# Patient Record
Sex: Male | Born: 1955 | Race: White | Hispanic: No | Marital: Single | Smoking: Former smoker
Health system: Southern US, Community
[De-identification: ages and names within clinical notes are randomized; demographics above are authoritative.]

## PROBLEM LIST (undated history)

## (undated) DIAGNOSIS — J45909 Unspecified asthma, uncomplicated: Secondary | ICD-10-CM

## (undated) DIAGNOSIS — E785 Hyperlipidemia, unspecified: Secondary | ICD-10-CM

## (undated) DIAGNOSIS — J449 Chronic obstructive pulmonary disease, unspecified: Secondary | ICD-10-CM

## (undated) DIAGNOSIS — M1990 Unspecified osteoarthritis, unspecified site: Secondary | ICD-10-CM

## (undated) DIAGNOSIS — T7840XA Allergy, unspecified, initial encounter: Secondary | ICD-10-CM

## (undated) DIAGNOSIS — F331 Major depressive disorder, recurrent, moderate: Secondary | ICD-10-CM

## (undated) DIAGNOSIS — K219 Gastro-esophageal reflux disease without esophagitis: Secondary | ICD-10-CM

## (undated) HISTORY — DX: Major depressive disorder, recurrent, moderate: F33.1

## (undated) HISTORY — DX: Allergy, unspecified, initial encounter: T78.40XA

## (undated) HISTORY — DX: Chronic obstructive pulmonary disease, unspecified: J44.9

## (undated) HISTORY — DX: Hyperlipidemia, unspecified: E78.5

## (undated) HISTORY — DX: Unspecified osteoarthritis, unspecified site: M19.90

## (undated) HISTORY — DX: Gastro-esophageal reflux disease without esophagitis: K21.9

## (undated) HISTORY — DX: Unspecified asthma, uncomplicated: J45.909

## (undated) HISTORY — PX: FOOT SURGERY: SHX648

---

## 2012-09-07 DIAGNOSIS — M25561 Pain in right knee: Secondary | ICD-10-CM | POA: Insufficient documentation

## 2012-09-07 DIAGNOSIS — S069X9A Unspecified intracranial injury with loss of consciousness of unspecified duration, initial encounter: Secondary | ICD-10-CM | POA: Insufficient documentation

## 2013-03-19 DIAGNOSIS — Z8551 Personal history of malignant neoplasm of bladder: Secondary | ICD-10-CM | POA: Insufficient documentation

## 2013-03-19 DIAGNOSIS — F331 Major depressive disorder, recurrent, moderate: Secondary | ICD-10-CM

## 2013-03-19 HISTORY — DX: Major depressive disorder, recurrent, moderate: F33.1

## 2013-04-09 DIAGNOSIS — E785 Hyperlipidemia, unspecified: Secondary | ICD-10-CM | POA: Insufficient documentation

## 2013-04-16 DIAGNOSIS — E559 Vitamin D deficiency, unspecified: Secondary | ICD-10-CM | POA: Insufficient documentation

## 2013-04-18 DIAGNOSIS — J449 Chronic obstructive pulmonary disease, unspecified: Secondary | ICD-10-CM | POA: Insufficient documentation

## 2013-04-18 DIAGNOSIS — G8929 Other chronic pain: Secondary | ICD-10-CM | POA: Insufficient documentation

## 2013-04-18 DIAGNOSIS — M25569 Pain in unspecified knee: Secondary | ICD-10-CM

## 2013-04-18 DIAGNOSIS — M5136 Other intervertebral disc degeneration, lumbar region: Secondary | ICD-10-CM | POA: Insufficient documentation

## 2015-10-27 DIAGNOSIS — K219 Gastro-esophageal reflux disease without esophagitis: Secondary | ICD-10-CM

## 2015-10-27 HISTORY — DX: Gastro-esophageal reflux disease without esophagitis: K21.9

## 2016-02-25 DIAGNOSIS — N401 Enlarged prostate with lower urinary tract symptoms: Secondary | ICD-10-CM | POA: Insufficient documentation

## 2016-02-25 DIAGNOSIS — R3912 Poor urinary stream: Principal | ICD-10-CM

## 2016-05-05 ENCOUNTER — Ambulatory Visit (INDEPENDENT_AMBULATORY_CARE_PROVIDER_SITE_OTHER): Payer: Medicaid Other

## 2016-05-05 ENCOUNTER — Ambulatory Visit (INDEPENDENT_AMBULATORY_CARE_PROVIDER_SITE_OTHER): Payer: Medicaid Other | Admitting: Family Medicine

## 2016-05-05 ENCOUNTER — Encounter: Payer: Self-pay | Admitting: Family Medicine

## 2016-05-05 ENCOUNTER — Encounter (INDEPENDENT_AMBULATORY_CARE_PROVIDER_SITE_OTHER): Payer: Self-pay

## 2016-05-05 VITALS — BP 138/88 | HR 72 | Temp 98.6°F | Ht 71.0 in | Wt 217.0 lb

## 2016-05-05 DIAGNOSIS — G8929 Other chronic pain: Secondary | ICD-10-CM

## 2016-05-05 DIAGNOSIS — M25561 Pain in right knee: Secondary | ICD-10-CM

## 2016-05-05 DIAGNOSIS — S069X0S Unspecified intracranial injury without loss of consciousness, sequela: Secondary | ICD-10-CM | POA: Diagnosis not present

## 2016-05-05 DIAGNOSIS — N401 Enlarged prostate with lower urinary tract symptoms: Secondary | ICD-10-CM

## 2016-05-05 DIAGNOSIS — R3912 Poor urinary stream: Secondary | ICD-10-CM

## 2016-05-05 DIAGNOSIS — J449 Chronic obstructive pulmonary disease, unspecified: Secondary | ICD-10-CM

## 2016-05-05 DIAGNOSIS — K219 Gastro-esophageal reflux disease without esophagitis: Secondary | ICD-10-CM

## 2016-05-05 DIAGNOSIS — M5416 Radiculopathy, lumbar region: Secondary | ICD-10-CM | POA: Diagnosis not present

## 2016-05-05 DIAGNOSIS — E785 Hyperlipidemia, unspecified: Secondary | ICD-10-CM | POA: Diagnosis not present

## 2016-05-05 DIAGNOSIS — Z8551 Personal history of malignant neoplasm of bladder: Secondary | ICD-10-CM | POA: Diagnosis not present

## 2016-05-05 DIAGNOSIS — F331 Major depressive disorder, recurrent, moderate: Secondary | ICD-10-CM | POA: Diagnosis not present

## 2016-05-05 DIAGNOSIS — M5136 Other intervertebral disc degeneration, lumbar region: Secondary | ICD-10-CM | POA: Diagnosis not present

## 2016-05-05 LAB — URINALYSIS, COMPLETE
Bilirubin, UA: NEGATIVE
Glucose, UA: NEGATIVE
KETONES UA: NEGATIVE
Leukocytes, UA: NEGATIVE
Nitrite, UA: NEGATIVE
Protein, UA: NEGATIVE
Urobilinogen, Ur: 0.2 mg/dL (ref 0.2–1.0)
pH, UA: 5.5 (ref 5.0–7.5)

## 2016-05-05 LAB — MICROSCOPIC EXAMINATION
Bacteria, UA: NONE SEEN
RENAL EPITHEL UA: NONE SEEN /HPF
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
## 2016-05-06 LAB — LIPID PANEL
CHOLESTEROL TOTAL: 184 mg/dL (ref 100–199)
Chol/HDL Ratio: 4.5 ratio units (ref 0.0–5.0)
HDL: 41 mg/dL (ref >39)
LDL CALC: 109 mg/dL — AB (ref 0–99)
TRIGLYCERIDES: 171 mg/dL — AB (ref 0–149)
VLDL CHOLESTEROL CAL: 34 mg/dL (ref 5–40)

## 2016-05-06 LAB — CMP14+EGFR
ALK PHOS: 107 IU/L (ref 39–117)
ALT: 18 IU/L (ref 0–44)
AST: 24 IU/L (ref 0–40)
Albumin/Globulin Ratio: 1.6 (ref 1.2–2.2)
Albumin: 4.3 g/dL (ref 3.5–5.5)
BILIRUBIN TOTAL: 0.3 mg/dL (ref 0.0–1.2)
BUN/Creatinine Ratio: 14 (ref 9–20)
BUN: 15 mg/dL (ref 6–24)
CHLORIDE: 100 mmol/L (ref 96–106)
CO2: 21 mmol/L (ref 18–29)
CREATININE: 1.1 mg/dL (ref 0.76–1.27)
Calcium: 9.3 mg/dL (ref 8.7–10.2)
GFR calc Af Amer: 84 mL/min/{1.73_m2} (ref >59)
GFR calc non Af Amer: 73 mL/min/{1.73_m2} (ref >59)
GLUCOSE: 89 mg/dL (ref 65–99)
Globulin, Total: 2.7 g/dL (ref 1.5–4.5)
Potassium: 4 mmol/L (ref 3.5–5.2)
SODIUM: 141 mmol/L (ref 134–144)
Total Protein: 7 g/dL (ref 6.0–8.5)

## 2016-05-06 LAB — CBC WITH DIFFERENTIAL/PLATELET
BASOS ABS: 0.1 10*3/uL (ref 0.0–0.2)
Basos: 1 %
EOS (ABSOLUTE): 0.9 10*3/uL — AB (ref 0.0–0.4)
Eos: 13 %
Hematocrit: 46.1 % (ref 37.5–51.0)
Hemoglobin: 14.8 g/dL (ref 13.0–17.7)
Immature Grans (Abs): 0 10*3/uL (ref 0.0–0.1)
Immature Granulocytes: 0 %
LYMPHS ABS: 1.5 10*3/uL (ref 0.7–3.1)
Lymphs: 21 %
MCH: 30 pg (ref 26.6–33.0)
MCHC: 32.1 g/dL (ref 31.5–35.7)
MCV: 93 fL (ref 79–97)
MONOCYTES: 11 %
MONOS ABS: 0.8 10*3/uL (ref 0.1–0.9)
Neutrophils Absolute: 3.9 10*3/uL (ref 1.4–7.0)
Neutrophils: 54 %
PLATELETS: 156 10*3/uL (ref 150–379)
RBC: 4.94 x10E6/uL (ref 4.14–5.80)
RDW: 14.2 % (ref 12.3–15.4)
WBC: 7.1 10*3/uL (ref 3.4–10.8)

## 2016-05-07 NOTE — Progress Notes (Signed)
Your chest x-ray looked normal. Thanks, WS.

## 2016-07-13 ENCOUNTER — Other Ambulatory Visit: Payer: Self-pay | Admitting: *Deleted

## 2016-07-13 MED ORDER — ALBUTEROL SULFATE HFA 108 (90 BASE) MCG/ACT IN AERS: INHALATION_SPRAY | RESPIRATORY_TRACT | 3 refills | Status: AC

## 2016-08-02 ENCOUNTER — Other Ambulatory Visit: Payer: Self-pay | Admitting: Family Medicine

## 2016-08-15 ENCOUNTER — Ambulatory Visit: Payer: Medicaid Other | Admitting: Family Medicine

## 2016-08-16 ENCOUNTER — Encounter: Payer: Self-pay | Admitting: Family Medicine

## 2016-08-16 ENCOUNTER — Telehealth: Payer: Self-pay | Admitting: Family Medicine

## 2016-08-16 NOTE — Telephone Encounter (Signed)
Attempted to contact patient- number is no longer in service.

## 2016-10-09 ENCOUNTER — Other Ambulatory Visit: Payer: Self-pay | Admitting: Family Medicine

## 2016-10-30 ENCOUNTER — Telehealth: Payer: Self-pay | Admitting: Family Medicine

## 2016-10-30 NOTE — Telephone Encounter (Signed)
Pt recent hospitalization  appt scheduled for follow up

## 2016-10-31 ENCOUNTER — Other Ambulatory Visit: Payer: Self-pay | Admitting: Family Medicine

## 2016-11-01 ENCOUNTER — Ambulatory Visit (INDEPENDENT_AMBULATORY_CARE_PROVIDER_SITE_OTHER): Payer: Medicaid Other | Admitting: Family Medicine

## 2016-11-01 ENCOUNTER — Ambulatory Visit (INDEPENDENT_AMBULATORY_CARE_PROVIDER_SITE_OTHER): Payer: Medicaid Other

## 2016-11-01 ENCOUNTER — Encounter: Payer: Self-pay | Admitting: Family Medicine

## 2016-11-01 VITALS — BP 121/75 | HR 64 | Temp 98.6°F | Ht 71.0 in | Wt 222.0 lb

## 2016-11-01 DIAGNOSIS — N2889 Other specified disorders of kidney and ureter: Secondary | ICD-10-CM | POA: Diagnosis not present

## 2016-11-01 DIAGNOSIS — J449 Chronic obstructive pulmonary disease, unspecified: Secondary | ICD-10-CM

## 2016-11-01 DIAGNOSIS — R0602 Shortness of breath: Secondary | ICD-10-CM

## 2016-11-01 MED ORDER — BUDESONIDE-FORMOTEROL FUMARATE 160-4.5 MCG/ACT IN AERO: 2.0000 | INHALATION_SPRAY | Freq: Two times a day (BID) | RESPIRATORY_TRACT | 3 refills | Status: AC

## 2016-11-01 MED ORDER — LEVOFLOXACIN 500 MG PO TABS: 500.0000 mg | ORAL_TABLET | Freq: Every day | ORAL | 0 refills | Status: DC

## 2016-11-01 MED ORDER — PREDNISONE 10 MG PO TABS: ORAL_TABLET | ORAL | 0 refills | Status: DC

## 2016-11-01 NOTE — Telephone Encounter (Signed)
Refills sent in for 6 months as pt was just seen today. Ok per [employer].
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
## 2016-11-01 NOTE — Telephone Encounter (Signed)
Authorize 30 days only. Then contact the patient letting them know that they will need an appointment before any further prescriptions can be sent in. 

## 2016-11-01 NOTE — Telephone Encounter (Signed)
Last seen 05/05/16  Dr Stacks 

## 2016-11-13 ENCOUNTER — Telehealth: Payer: Self-pay | Admitting: Family Medicine

## 2016-11-13 ENCOUNTER — Other Ambulatory Visit: Payer: Self-pay | Admitting: Family Medicine

## 2016-11-13 DIAGNOSIS — J449 Chronic obstructive pulmonary disease, unspecified: Secondary | ICD-10-CM

## 2016-11-13 MED ORDER — ALBUTEROL SULFATE 1.25 MG/3ML IN NEBU: 1.0000 | INHALATION_SOLUTION | Freq: Four times a day (QID) | RESPIRATORY_TRACT | 12 refills | Status: DC | PRN

## 2016-11-13 NOTE — Telephone Encounter (Signed)
I sent in the requested prescription 

## 2016-11-14 NOTE — Telephone Encounter (Signed)
Naomi aware that prescription has been sent in

## 2016-11-20 ENCOUNTER — Telehealth: Payer: Self-pay | Admitting: Family Medicine

## 2016-11-20 DIAGNOSIS — J209 Acute bronchitis, unspecified: Secondary | ICD-10-CM

## 2016-11-20 DIAGNOSIS — J449 Chronic obstructive pulmonary disease, unspecified: Secondary | ICD-10-CM

## 2016-11-20 DIAGNOSIS — J440 Chronic obstructive pulmonary disease with acute lower respiratory infection: Principal | ICD-10-CM

## 2016-11-20 NOTE — Telephone Encounter (Signed)
DME order printed and placed on his desk to sign and pt will come by and grab it tomorrow after lunch.

## 2016-11-20 NOTE — Telephone Encounter (Signed)
Please send order for nebulizer machine to [facility]

## 2016-11-22 ENCOUNTER — Other Ambulatory Visit: Payer: Self-pay | Admitting: Family Medicine

## 2016-11-23 ENCOUNTER — Other Ambulatory Visit: Payer: Self-pay | Admitting: Family Medicine

## 2016-12-03 ENCOUNTER — Other Ambulatory Visit: Payer: Self-pay | Admitting: Family Medicine

## 2016-12-05 ENCOUNTER — Encounter: Payer: Self-pay | Admitting: Family Medicine

## 2016-12-05 ENCOUNTER — Ambulatory Visit (INDEPENDENT_AMBULATORY_CARE_PROVIDER_SITE_OTHER): Payer: Medicaid Other | Admitting: Family Medicine

## 2016-12-05 VITALS — BP 110/71 | HR 58 | Temp 99.0°F | Ht 71.0 in | Wt 219.0 lb

## 2016-12-05 DIAGNOSIS — J449 Chronic obstructive pulmonary disease, unspecified: Secondary | ICD-10-CM

## 2016-12-05 NOTE — Progress Notes (Signed)
Subjective:  Patient ID: Victor Hanson, male    DOB: 1955-05-06  Age: 61 y.o. MRN: [id number]  CC: Follow-up (pt here today for 1 month follow up)   HPI Victor Hanson presents for Recheck of his emphysema. He is not getting short of breath anymore. His son-in-law is with him today and says that he has started managing his medications. Patient admits that he was confused before and that his son-in-law's help his been very useful.  Depression screen [facility] 2/9 11/01/2016 05/05/2016  Decreased Interest 0 0  Down, Depressed, Hopeless 1 1  PHQ - 2 Score 1 1    History Victor Hanson has a past medical history of Allergy; Arthritis; Asthma; COPD (chronic obstructive pulmonary disease) ([place]); Depression, major, recurrent, moderate ([place]) (03/19/2013); Gastroesophageal reflux disease without esophagitis (10/27/2015); and Hyperlipidemia.   He has a past surgical history that includes Foot surgery.   His family history includes Diabetes in his mother; Heart disease (age of onset: 14) in his father; Hypertension in his father.He reports that he quit smoking about 4 years ago. He has never used smokeless tobacco. He reports that he does not drink alcohol or use drugs.    ROS Review of Systems  Constitutional: Negative for chills, diaphoresis and fever.  HENT: Negative for rhinorrhea and sore throat.   Respiratory: Negative for cough and shortness of breath.   Cardiovascular: Negative for chest pain.  Gastrointestinal: Negative for abdominal pain.  Skin: Negative for rash.    Objective:  BP 110/71   Pulse (!) 58   Temp 99 F (37.2 C) (Oral)   Ht 5' 11" (1.803 m)   Wt 219 lb (99.3 kg)   BMI 30.54 kg/m   BP Readings from Last 3 Encounters:  12/05/16 110/71  11/01/16 121/75  05/05/16 138/88    Wt Readings from Last 3 Encounters:  12/05/16 219 lb (99.3 kg)  11/01/16 222 lb (100.7 kg)  05/05/16 217 lb (98.4 kg)     Physical Exam  Constitutional: He is oriented to person, place, and time. He  appears well-developed and well-nourished.  HENT:  Head: Normocephalic and atraumatic.  Right Ear: External ear normal.  Left Ear: External ear normal.  Mouth/Throat: No oropharyngeal exudate or posterior oropharyngeal erythema.  Eyes: Pupils are equal, round, and reactive to light.  Neck: Normal range of motion. Neck supple.  Cardiovascular: Normal rate and regular rhythm.   No murmur heard. Pulmonary/Chest: Breath sounds normal. No respiratory distress.  Neurological: He is alert and oriented to person, place, and time.  Vitals reviewed.     Assessment & Plan:   Victor Hanson was seen today for follow-up.  Diagnoses and all orders for this visit:  Chronic obstructive pulmonary disease, unspecified COPD type ([place]) -     CMP14+EGFR -     CBC with Differential/Platelet -     Lipid panel -     PR BREATHING CAPACITY TEST       I have discontinued Victor Hanson predniSONE and levofloxacin. I am also having him maintain his aspirin EC, loratadine, nitroGLYCERIN, tamsulosin, albuterol, pantoprazole, furosemide, sertraline, buPROPion, budesonide-formoterol, albuterol, atorvastatin, doxepin, meloxicam, clopidogrel, and metoprolol tartrate.  Allergies as of 12/05/2016   No Known Allergies     Medication List       Accurate as of 12/05/16  5:19 PM. Always use your most recent med list.          albuterol 108 (90 Base) MCG/ACT inhaler Commonly known as:  PROVENTIL HFA;VENTOLIN HFA INHALE 2 PUFFS BY  MOUTH EVERY 4-6 HOURS AS NEEDED   albuterol 1.25 MG/3ML nebulizer solution Commonly known as:  ACCUNEB Take 3 mLs (1.25 mg total) by nebulization every 6 (six) hours as needed for wheezing.   aspirin EC 81 MG tablet Take 81 mg by mouth.   atorvastatin 40 MG tablet Commonly known as:  LIPITOR TAKE 1 TABLET (40 MG TOTAL) BY MOUTH DAILY AT 6PM.   budesonide-formoterol 160-4.5 MCG/ACT inhaler Commonly known as:  SYMBICORT Inhale 2 puffs into the lungs 2 (two) times daily.   buPROPion  100 MG 12 hr tablet Commonly known as:  WELLBUTRIN SR TAKE 1 TABLET EVERY MORNING   clopidogrel 75 MG tablet Commonly known as:  PLAVIX TAKE 1 TABLET (75 MG TOTAL) BY MOUTH DAILY.   doxepin 10 MG capsule Commonly known as:  SINEQUAN TAKE 1 CAPSULE BY MOUTH THREE TIMES A DAY   furosemide 20 MG tablet Commonly known as:  LASIX TAKE 1 TABLET BY MOUTH EVERY DAY   loratadine 10 MG tablet Commonly known as:  CLARITIN Take 10 mg by mouth.   meloxicam 15 MG tablet Commonly known as:  MOBIC TAKE 1 TABLET EVERY DAY AS NEEDED FOR PAIN   metoprolol tartrate 25 MG tablet Commonly known as:  LOPRESSOR TAKE 1/2 TABLET TWICE A DAY   nitroGLYCERIN 0.4 MG SL tablet Commonly known as:  NITROSTAT Place 0.4 mg under the tongue.   pantoprazole 40 MG tablet Commonly known as:  PROTONIX TAKE 1 TABLET BY MOUTH EVERY DAY   sertraline 100 MG tablet Commonly known as:  ZOLOFT TAKE 1 AND 1/2 TABLETS BY MOUTH EVERY DAY   tamsulosin 0.4 MG Caps capsule Commonly known as:  FLOMAX Take 0.4 mg by mouth daily.            Discharge Care Instructions        Start     Ordered   12/05/16 0000  CMP14+EGFR     12/05/16 1600   12/05/16 0000  CBC with Differential/Platelet     12/05/16 1600   12/05/16 0000  Lipid panel     12/05/16 1600   12/05/16 0000  PR BREATHING CAPACITY TEST     12/05/16 1600       Follow-up: No Follow-up on file.  Claretta Fraise, M.D.

## 2016-12-06 LAB — CBC WITH DIFFERENTIAL/PLATELET
Basophils Absolute: 0.1 10*3/uL (ref 0.0–0.2)
Basos: 1 %
EOS (ABSOLUTE): 0.8 10*3/uL — AB (ref 0.0–0.4)
Eos: 10 %
HEMATOCRIT: 39.9 % (ref 37.5–51.0)
Hemoglobin: 13 g/dL (ref 13.0–17.7)
IMMATURE GRANULOCYTES: 0 %
Immature Grans (Abs): 0 10*3/uL (ref 0.0–0.1)
LYMPHS ABS: 1.2 10*3/uL (ref 0.7–3.1)
Lymphs: 15 %
MCH: 29.7 pg (ref 26.6–33.0)
MCHC: 32.6 g/dL (ref 31.5–35.7)
MCV: 91 fL (ref 79–97)
MONOS ABS: 0.9 10*3/uL (ref 0.1–0.9)
Monocytes: 11 %
NEUTROS PCT: 63 %
Neutrophils Absolute: 4.8 10*3/uL (ref 1.4–7.0)
PLATELETS: 127 10*3/uL — AB (ref 150–379)
RBC: 4.37 x10E6/uL (ref 4.14–5.80)
RDW: 14.1 % (ref 12.3–15.4)
WBC: 7.8 10*3/uL (ref 3.4–10.8)

## 2016-12-06 LAB — CMP14+EGFR
A/G RATIO: 1.9 (ref 1.2–2.2)
ALBUMIN: 4.2 g/dL (ref 3.6–4.8)
ALK PHOS: 114 IU/L (ref 39–117)
ALT: 16 IU/L (ref 0–44)
AST: 20 IU/L (ref 0–40)
BILIRUBIN TOTAL: 0.2 mg/dL (ref 0.0–1.2)
BUN / CREAT RATIO: 10 (ref 10–24)
BUN: 11 mg/dL (ref 8–27)
CO2: 26 mmol/L (ref 20–29)
Calcium: 8.7 mg/dL (ref 8.6–10.2)
Chloride: 104 mmol/L (ref 96–106)
Creatinine, Ser: 1.1 mg/dL (ref 0.76–1.27)
GFR calc Af Amer: 84 mL/min/{1.73_m2} (ref >59)
GFR calc non Af Amer: 73 mL/min/{1.73_m2} (ref >59)
GLOBULIN, TOTAL: 2.2 g/dL (ref 1.5–4.5)
Glucose: 86 mg/dL (ref 65–99)
POTASSIUM: 4.2 mmol/L (ref 3.5–5.2)
SODIUM: 145 mmol/L — AB (ref 134–144)
Total Protein: 6.4 g/dL (ref 6.0–8.5)

## 2016-12-06 LAB — LIPID PANEL
CHOL/HDL RATIO: 4.2 ratio (ref 0.0–5.0)
Cholesterol, Total: 158 mg/dL (ref 100–199)
HDL: 38 mg/dL — AB (ref >39)
LDL Calculated: 99 mg/dL (ref 0–99)
TRIGLYCERIDES: 103 mg/dL (ref 0–149)
VLDL Cholesterol Cal: 21 mg/dL (ref 5–40)

## 2017-01-02 ENCOUNTER — Encounter: Payer: Self-pay | Admitting: Family Medicine

## 2017-01-02 ENCOUNTER — Ambulatory Visit: Payer: Medicaid Other | Admitting: Family Medicine

## 2017-01-04 ENCOUNTER — Other Ambulatory Visit: Payer: Self-pay | Admitting: Family Medicine

## 2017-01-23 ENCOUNTER — Telehealth: Payer: Self-pay | Admitting: Family Medicine

## 2017-01-23 NOTE — Telephone Encounter (Signed)
Pt is having excessive drowsiness from meds started while pt in hospital appt scheduled for hospital follow up with PCP

## 2017-01-24 ENCOUNTER — Encounter: Payer: Self-pay | Admitting: Family Medicine

## 2017-01-24 ENCOUNTER — Ambulatory Visit (INDEPENDENT_AMBULATORY_CARE_PROVIDER_SITE_OTHER): Payer: Medicaid Other | Admitting: Family Medicine

## 2017-01-24 VITALS — BP 88/58 | HR 70 | Temp 98.4°F | Ht 71.0 in | Wt 217.0 lb

## 2017-01-24 DIAGNOSIS — J449 Chronic obstructive pulmonary disease, unspecified: Secondary | ICD-10-CM | POA: Diagnosis not present

## 2017-01-24 DIAGNOSIS — F331 Major depressive disorder, recurrent, moderate: Secondary | ICD-10-CM

## 2017-01-24 DIAGNOSIS — K219 Gastro-esophageal reflux disease without esophagitis: Secondary | ICD-10-CM | POA: Diagnosis not present

## 2017-01-24 DIAGNOSIS — Z23 Encounter for immunization: Secondary | ICD-10-CM | POA: Diagnosis not present

## 2017-01-24 DIAGNOSIS — S069X0S Unspecified intracranial injury without loss of consciousness, sequela: Secondary | ICD-10-CM

## 2017-01-24 MED ORDER — METOPROLOL TARTRATE 25 MG PO TABS: 12.5000 mg | ORAL_TABLET | Freq: Two times a day (BID) | ORAL | 1 refills | Status: AC

## 2017-01-24 MED ORDER — FUROSEMIDE 20 MG PO TABS: 20.0000 mg | ORAL_TABLET | Freq: Every day | ORAL | 1 refills | Status: AC

## 2017-01-24 MED ORDER — PANTOPRAZOLE SODIUM 40 MG PO TBEC: 40.0000 mg | DELAYED_RELEASE_TABLET | Freq: Every day | ORAL | 1 refills | Status: AC

## 2017-01-24 MED ORDER — MELOXICAM 15 MG PO TABS: ORAL_TABLET | ORAL | 1 refills | Status: AC

## 2017-01-24 MED ORDER — ATORVASTATIN CALCIUM 40 MG PO TABS: ORAL_TABLET | ORAL | 1 refills | Status: AC

## 2017-01-24 MED ORDER — CLOPIDOGREL BISULFATE 75 MG PO TABS: ORAL_TABLET | ORAL | 1 refills | Status: AC

## 2017-01-24 MED ORDER — LEVALBUTEROL HCL 0.63 MG/3ML IN NEBU: 0.6300 mg | INHALATION_SOLUTION | Freq: Two times a day (BID) | RESPIRATORY_TRACT | 5 refills | Status: AC

## 2017-01-24 MED ORDER — DOXEPIN HCL 10 MG PO CAPS: ORAL_CAPSULE | ORAL | 1 refills | Status: AC

## 2017-01-24 NOTE — Progress Notes (Signed)
Subjective:  Patient ID: Victor Hanson, male    DOB: [birth year]  Age: 61 y.o. MRN: [id number]  CC: Hospitalization Follow-up (pt here today for hospital follow up )   HPI Victor Hanson presents for Drowsiness since he was hospitalized couple weeks ago. He says he was on the second floor at [facility]. He was in the closed psychiatry Michel. He had an episode of depression. He says they started him on dicyclomine. Review of care everywhere shows that he also is taking Zoloft 150 mg daily and Wellbutrin SR 200 mg by mouth daily. He states the depression is much better now and of note his pH Q score below. Patient relates that his son and daughter-in-law had been arguing and he has to leave the house and go see a friend. However, that seems to exacerbate his anxiety. Additionally the patient tells me that he is getting excessively jittery after his nebulizer and even gets a little bit after using his albuterol inhaler. Otherwise his shortness of breath is better since the end of his hospitalization noted 3 weeks ago.  Depression screen [facility] 2/9 01/24/2017 11/01/2016 05/05/2016  Decreased Interest 0 0 0  Down, Depressed, Hopeless 0 1 1  PHQ - 2 Score 0 1 1    History Mahmoud has a past medical history of Allergy; Arthritis; Asthma; COPD (chronic obstructive pulmonary disease) (HCC); Depression, major, recurrent, moderate (HCC) (03/19/2013); Gastroesophageal reflux disease without esophagitis (10/27/2015); and Hyperlipidemia.   He has a past surgical history that includes Foot surgery.   His family history includes Diabetes in his mother; Heart disease (age of onset: 52) in his father; Hypertension in his father.He reports that he quit smoking about 4 years ago. He has never used smokeless tobacco. He reports that he does not drink alcohol or use drugs.    ROS Review of Systems  Constitutional: Negative for chills, diaphoresis, fever and unexpected weight change.  HENT: Negative for congestion,  hearing loss, rhinorrhea and sore throat.   Eyes: Negative for visual disturbance.  Respiratory: Negative for cough and shortness of breath.   Cardiovascular: Negative for chest pain.  Gastrointestinal: Negative for abdominal pain, constipation and diarrhea.  Genitourinary: Negative for dysuria and flank pain.  Musculoskeletal: Negative for arthralgias and joint swelling.  Skin: Negative for rash.  Neurological: Negative for dizziness and headaches.  Psychiatric/Behavioral: Negative for dysphoric mood and sleep disturbance.    Objective:  BP (!) 88/58   Pulse 70   Temp 98.4 F (36.9 C) (Oral)   Ht 5\' 11"  (1.803 m)   Wt 217 lb (98.4 kg)   BMI 30.27 kg/m   BP Readings from Last 3 Encounters:  01/24/17 (!) 88/58  12/05/16 110/71  11/01/16 121/75    Wt Readings from Last 3 Encounters:  01/24/17 217 lb (98.4 kg)  12/05/16 219 lb (99.3 kg)  11/01/16 222 lb (100.7 kg)     Physical Exam  Constitutional: He is oriented to person, place, and time. He appears well-developed and well-nourished. No distress.  HENT:  Head: Normocephalic and atraumatic.  Right Ear: External ear normal.  Left Ear: External ear normal.  Nose: Nose normal.  Mouth/Throat: Oropharynx is clear and moist.  Eyes: Pupils are equal, round, and reactive to light. Conjunctivae and EOM are normal.  Neck: Normal range of motion. Neck supple. No thyromegaly present.  Cardiovascular: Normal rate, regular rhythm and normal heart sounds.   No murmur heard. Pulmonary/Chest: Effort normal and breath sounds normal. No respiratory distress. He has no  wheezes. He has no rales.  Abdominal: Soft. Bowel sounds are normal. He exhibits no distension. There is no tenderness.  Musculoskeletal: Normal range of motion. He exhibits no edema.  Lymphadenopathy:    He has no cervical adenopathy.  Neurological: He is alert and oriented to person, place, and time. He has normal reflexes.  Skin: Skin is warm and dry.  Psychiatric:  He has a normal mood and affect. His behavior is normal. Judgment and thought content normal.      Assessment & Plan:   Romon was seen today for hospitalization follow-up.  Diagnoses and all orders for this visit:  Chronic obstructive pulmonary disease, unspecified COPD type (HCC)  Traumatic brain injury, without loss of consciousness, sequela (HCC)  Depression, major, recurrent, moderate (HCC)  Gastroesophageal reflux disease without esophagitis  Other orders -     levalbuterol (XOPENEX) 0.63 MG/3ML nebulizer solution; Take 3 mLs (0.63 mg total) by nebulization 2 (two) times daily. -     atorvastatin (LIPITOR) 40 MG tablet; TAKE 1 TABLET (40 MG TOTAL) BY MOUTH DAILY AT 6PM. -     clopidogrel (PLAVIX) 75 MG tablet; TAKE 1 TABLET (75 MG TOTAL) BY MOUTH DAILY. -     doxepin (SINEQUAN) 10 MG capsule; TAKE 1 CAPSULE BY MOUTH THREE TIMES A DAY -     furosemide (LASIX) 20 MG tablet; Take 1 tablet (20 mg total) by mouth daily. -     meloxicam (MOBIC) 15 MG tablet; TAKE 1 TABLET EVERY DAY AS NEEDED FOR PAIN -     metoprolol tartrate (LOPRESSOR) 25 MG tablet; Take 0.5 tablets (12.5 mg total) by mouth 2 (two) times daily. -     pantoprazole (PROTONIX) 40 MG tablet; Take 1 tablet (40 mg total) by mouth daily.       I have changed Mr. Grieger furosemide, metoprolol tartrate, and pantoprazole. I am also having him start on levalbuterol. Additionally, I am having him maintain his aspirin EC, loratadine, nitroGLYCERIN, tamsulosin, albuterol, sertraline, buPROPion, budesonide-formoterol, atorvastatin, clopidogrel, doxepin, and meloxicam.  Allergies as of 01/24/2017   No Known Allergies     Medication List       Accurate as of 01/24/17  3:52 PM. Always use your most recent med list.          albuterol 108 (90 Base) MCG/ACT inhaler Commonly known as:  PROVENTIL HFA;VENTOLIN HFA INHALE 2 PUFFS BY MOUTH EVERY 4-6 HOURS AS NEEDED   aspirin EC 81 MG tablet Take 81 mg by mouth.     atorvastatin 40 MG tablet Commonly known as:  LIPITOR TAKE 1 TABLET (40 MG TOTAL) BY MOUTH DAILY AT 6PM.   budesonide-formoterol 160-4.5 MCG/ACT inhaler Commonly known as:  SYMBICORT Inhale 2 puffs into the lungs 2 (two) times daily.   buPROPion 100 MG 12 hr tablet Commonly known as:  WELLBUTRIN SR TAKE 1 TABLET EVERY MORNING   clopidogrel 75 MG tablet Commonly known as:  PLAVIX TAKE 1 TABLET (75 MG TOTAL) BY MOUTH DAILY.   doxepin 10 MG capsule Commonly known as:  SINEQUAN TAKE 1 CAPSULE BY MOUTH THREE TIMES A DAY   furosemide 20 MG tablet Commonly known as:  LASIX Take 1 tablet (20 mg total) by mouth daily.   levalbuterol 0.63 MG/3ML nebulizer solution Commonly known as:  XOPENEX Take 3 mLs (0.63 mg total) by nebulization 2 (two) times daily.   loratadine 10 MG tablet Commonly known as:  CLARITIN Take 10 mg by mouth.   meloxicam 15 MG tablet Commonly known  as:  MOBIC TAKE 1 TABLET EVERY DAY AS NEEDED FOR PAIN   metoprolol tartrate 25 MG tablet Commonly known as:  LOPRESSOR Take 0.5 tablets (12.5 mg total) by mouth 2 (two) times daily.   nitroGLYCERIN 0.4 MG SL tablet Commonly known as:  NITROSTAT Place 0.4 mg under the tongue.   pantoprazole 40 MG tablet Commonly known as:  PROTONIX Take 1 tablet (40 mg total) by mouth daily.   sertraline 100 MG tablet Commonly known as:  ZOLOFT TAKE 1 AND 1/2 TABLETS BY MOUTH EVERY DAY   tamsulosin 0.4 MG Caps capsule Commonly known as:  FLOMAX Take 0.4 mg by mouth daily.        Follow-up: Return in about 6 months (around 07/25/2017).  Mechele ClaudeWarren Jarret Torre, M.D.

## 2017-02-28 ENCOUNTER — Telehealth: Payer: Self-pay | Admitting: Family Medicine

## 2017-02-28 ENCOUNTER — Telehealth: Payer: Self-pay

## 2017-02-28 NOTE — Telephone Encounter (Signed)
Angelica ChessmanMandy took the call

## 2017-02-28 NOTE — Telephone Encounter (Signed)
Patient states [facility] and wanted you to be aware. [facility] [place]. He will continue to come here for care

## 2017-06-04 ENCOUNTER — Ambulatory Visit: Payer: Medicaid Other | Admitting: Family Medicine

## 2017-07-25 ENCOUNTER — Ambulatory Visit: Payer: Medicaid Other | Admitting: Family Medicine

## 2017-07-30 ENCOUNTER — Encounter: Payer: Self-pay | Admitting: Family Medicine

## 2018-04-10 IMAGING — DX DG CHEST 2V
2 series · 2 of 2 positions shown · non-contrast
Comparison: 05/05/2016

CLINICAL DATA: 61-year-old male with a history of shortness of
breath

EXAM:
CHEST  2 VIEW

[chest pa]
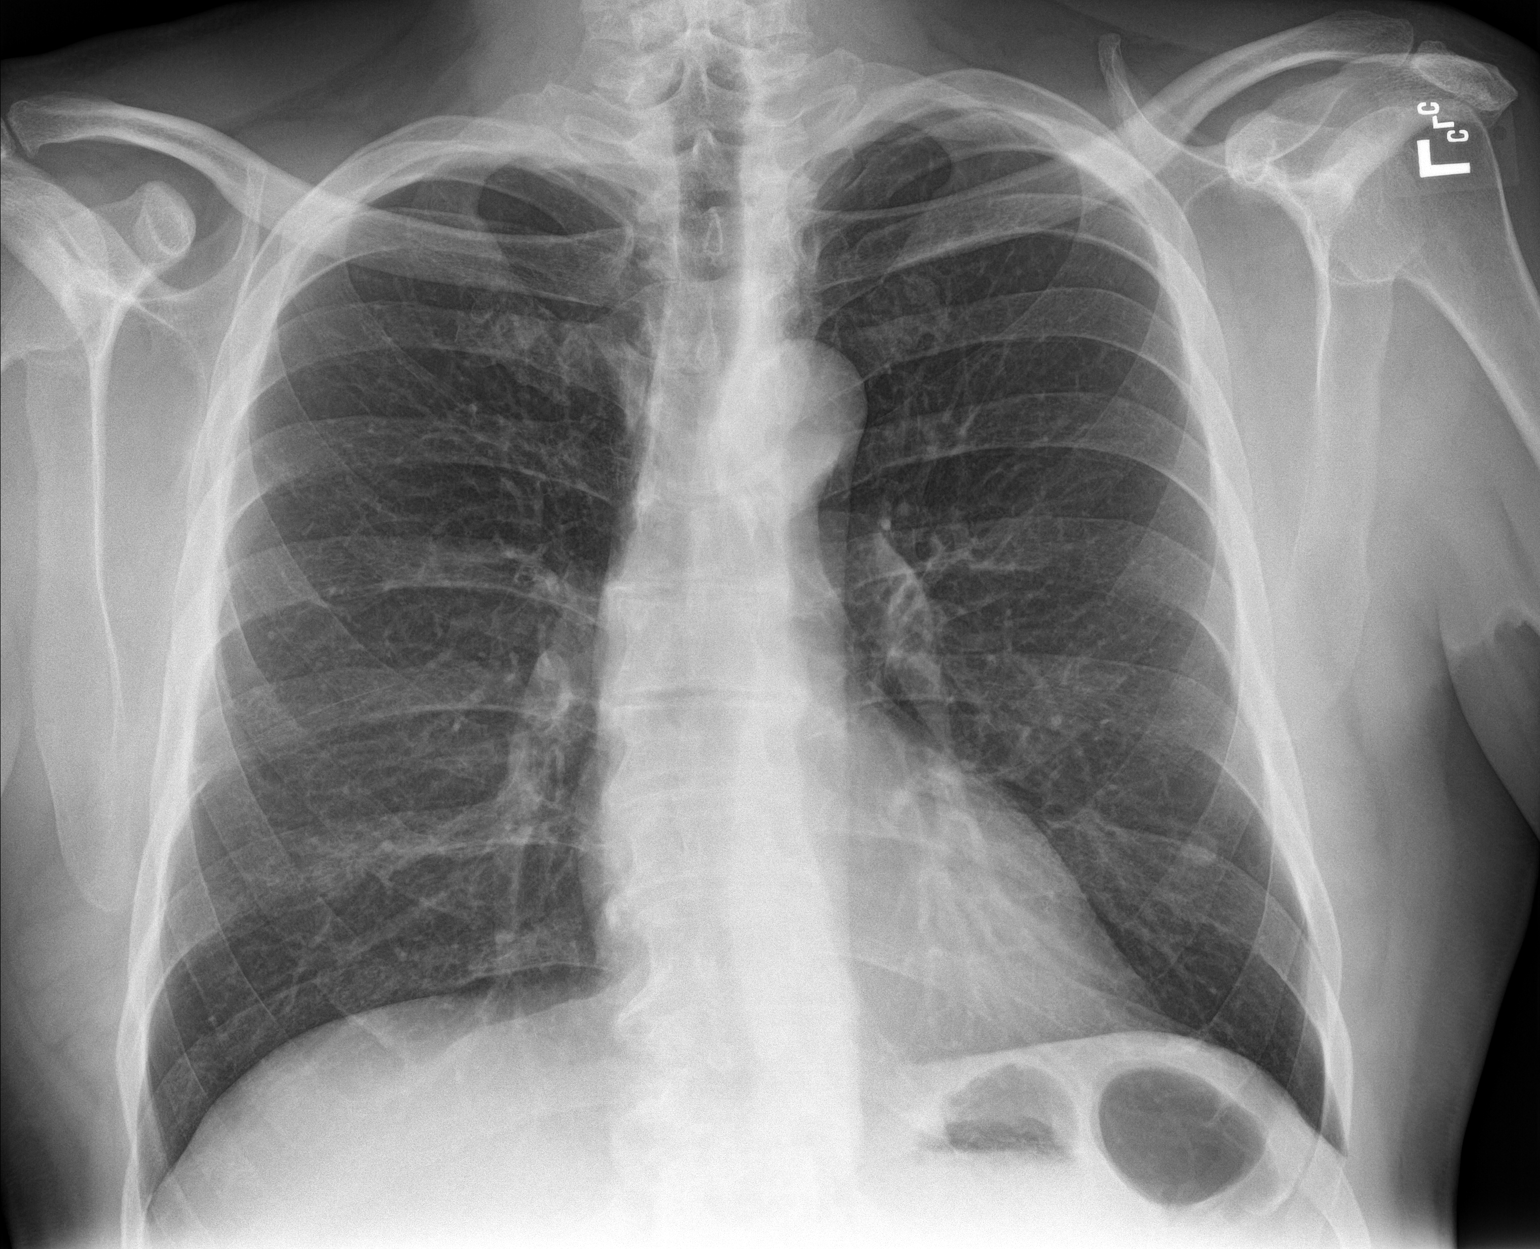

[chest lat]
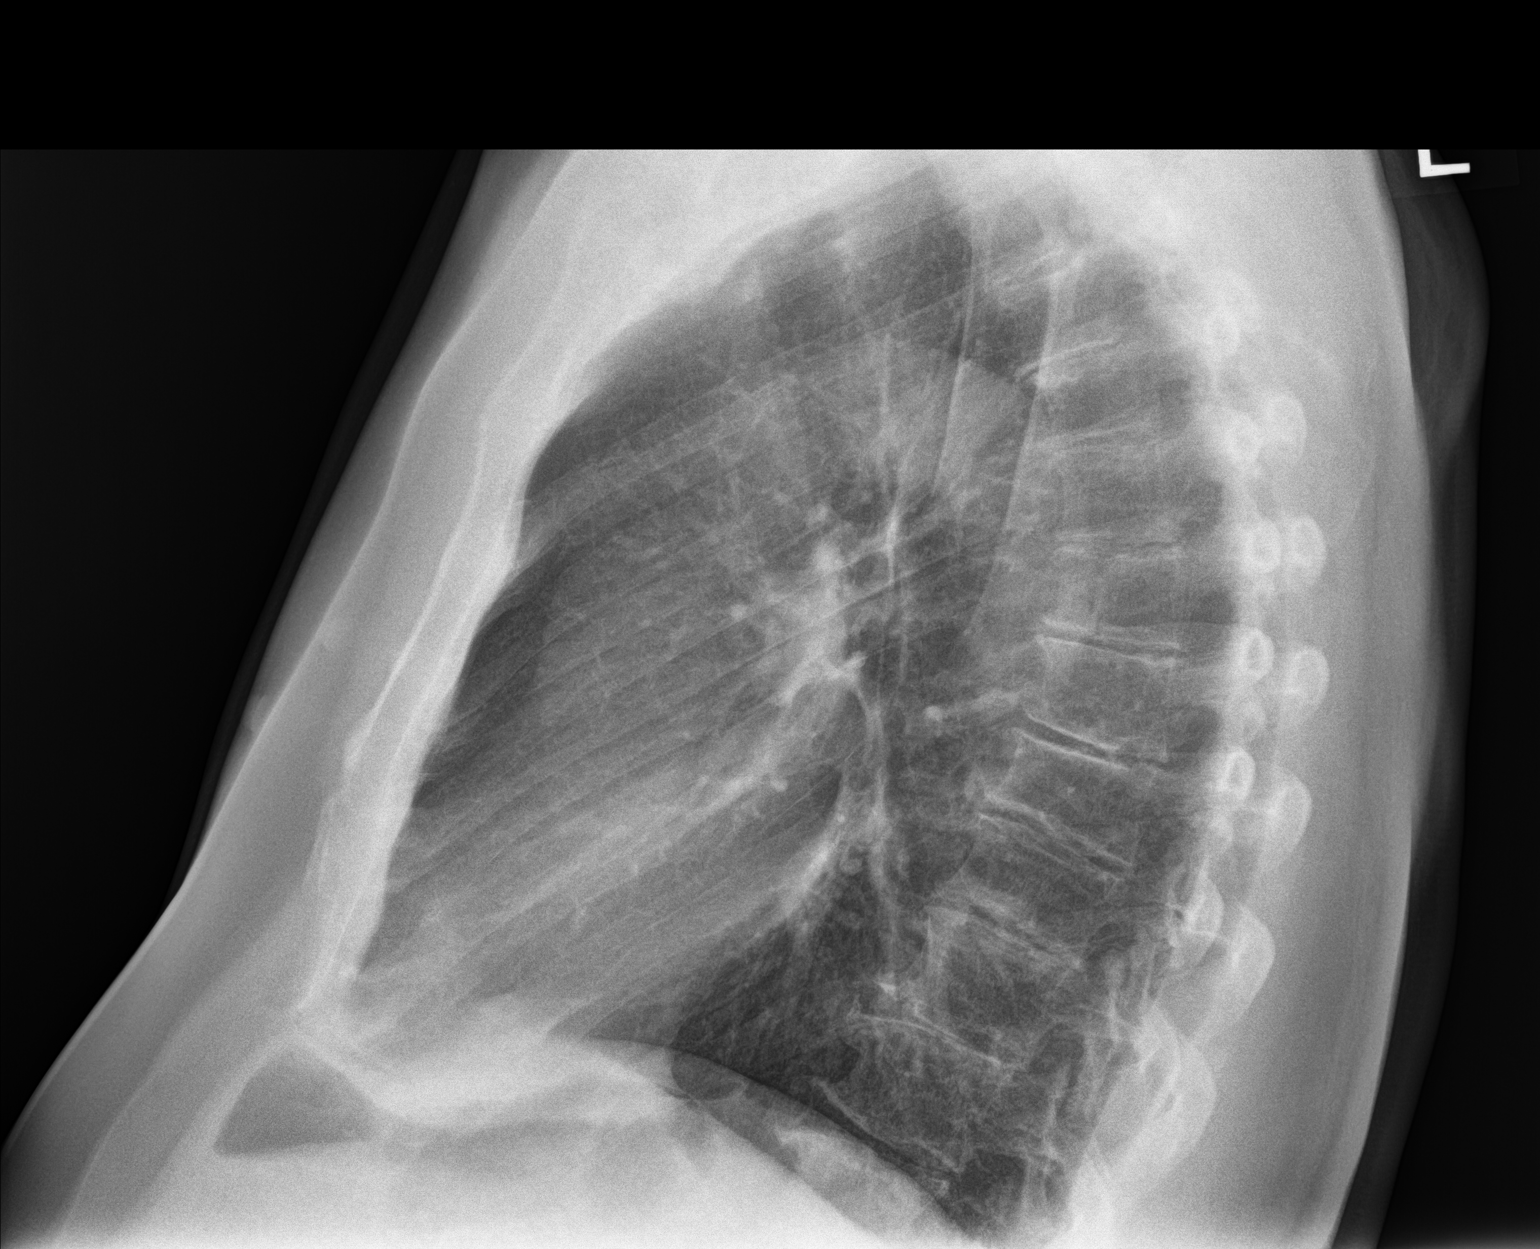

[2 of 2 positions shown; findings below may reference images not displayed]

FINDINGS: Cardiomediastinal silhouette unchanged in size and contour. No
evidence of central vascular congestion. No pneumothorax or pleural
effusion. No confluent airspace disease.

Irregular nodularity at the base the right lung interposed between
the anterior fifth and sixth rib, more pronounced than the
comparison.

Soft tissue nodule at the lower left chest overlying the anterior
sixth rib, appears to been present on the comparison plain film
likely nipple.

Degenerative changes of the thoracic spine. No acute displaced
fracture.
IMPRESSION: There is ill-defined nodularity at the base the right lung,
interposed between the anterior fifth and sixth ribs. Uncertain if
this represents developing infection, scarring/atelectasis, or lung
nodule. Repeat chest x-ray in 6 weeks after the patient has been
treated may be considered, or alternatively, noncontrast chest CT.
